# Patient Record
Sex: Female | Born: 2000 | Race: White | Hispanic: No | Smoking: Never smoker
Health system: Southern US, Community
[De-identification: ages and names within clinical notes are randomized; demographics above are authoritative.]

## PROBLEM LIST (undated history)

## (undated) DIAGNOSIS — I471 Supraventricular tachycardia, unspecified: Secondary | ICD-10-CM

## (undated) DIAGNOSIS — E079 Disorder of thyroid, unspecified: Secondary | ICD-10-CM

---

## 2021-07-05 ENCOUNTER — Other Ambulatory Visit: Payer: Self-pay

## 2021-07-05 ENCOUNTER — Emergency Department: Payer: BC Managed Care – PPO

## 2021-07-05 ENCOUNTER — Emergency Department
Admission: EM | Admit: 2021-07-05 | Discharge: 2021-07-05 | Disposition: A | Discharge status: Home / Self Care | Payer: BC Managed Care – PPO | Attending: Emergency Medicine | Admitting: Emergency Medicine

## 2021-07-05 DIAGNOSIS — R109 Unspecified abdominal pain: Secondary | ICD-10-CM

## 2021-07-05 DIAGNOSIS — R1031 Right lower quadrant pain: Secondary | ICD-10-CM | POA: Diagnosis not present

## 2021-07-05 DIAGNOSIS — N9489 Other specified conditions associated with female genital organs and menstrual cycle: Secondary | ICD-10-CM | POA: Diagnosis not present

## 2021-07-05 DIAGNOSIS — R0789 Other chest pain: Secondary | ICD-10-CM | POA: Diagnosis not present

## 2021-07-05 DIAGNOSIS — R079 Chest pain, unspecified: Secondary | ICD-10-CM

## 2021-07-05 DIAGNOSIS — R42 Dizziness and giddiness: Secondary | ICD-10-CM | POA: Diagnosis not present

## 2021-07-05 HISTORY — DX: Supraventricular tachycardia, unspecified: I47.10

## 2021-07-05 HISTORY — DX: Disorder of thyroid, unspecified: E07.9

## 2021-07-05 HISTORY — DX: Supraventricular tachycardia: I47.1

## 2021-07-05 LAB — CBC
HCT: 43.3 % (ref 36.0–46.0)
Hemoglobin: 14.7 g/dL (ref 12.0–15.0)
MCH: 29.5 pg (ref 26.0–34.0)
MCHC: 33.9 g/dL (ref 30.0–36.0)
MCV: 86.8 fL (ref 80.0–100.0)
Platelets: 395 10*3/uL (ref 150–400)
RBC: 4.99 MIL/uL (ref 3.87–5.11)
RDW: 12.6 % (ref 11.5–15.5)
WBC: 9.1 10*3/uL (ref 4.0–10.5)
nRBC: 0 % (ref 0.0–0.2)

## 2021-07-05 LAB — BASIC METABOLIC PANEL
Anion gap: 8 (ref 5–15)
BUN: 13 mg/dL (ref 6–20)
CO2: 24 mmol/L (ref 22–32)
Calcium: 9.7 mg/dL (ref 8.9–10.3)
Chloride: 106 mmol/L (ref 98–111)
Creatinine, Ser: 0.71 mg/dL (ref 0.44–1.00)
GFR, Estimated: >60 mL/min (ref >60)
Glucose, Bld: 101 mg/dL — ABNORMAL HIGH (ref 70–99)
Potassium: 4.2 mmol/L (ref 3.5–5.1)
Sodium: 138 mmol/L (ref 135–145)

## 2021-07-05 LAB — TROPONIN I (HIGH SENSITIVITY): Troponin I (High Sensitivity): 2 ng/L (ref <18)

## 2021-07-05 LAB — HCG, QUANTITATIVE, PREGNANCY: hCG, Beta Chain, Quant, S: <1 m[IU]/mL (ref <5)

## 2021-07-05 MED ORDER — LIDOCAINE VISCOUS HCL 2 % MT SOLN
15.0000 mL | Freq: Once | OROMUCOSAL | Status: AC
  Administered 2021-07-05: 15 mL via ORAL
  Filled 2021-07-05: qty 15

## 2021-07-05 MED ORDER — ALUM & MAG HYDROXIDE-SIMETH 200-200-20 MG/5ML PO SUSP
30.0000 mL | Freq: Once | ORAL | Status: AC
  Administered 2021-07-05: 30 mL via ORAL
  Filled 2021-07-05: qty 30

## 2021-07-05 NOTE — ED Notes (Signed)
Pt is tearful and anxious at time of discharge. RN assisted pt through deep breathing exercises until she was comfortable prior to reviewing discharge instructions. Unable to obtain signature for discharge as computer at bedside will not turn on. Pt verbalized understanding of instructions and will call cardiologist today as recommended. Ambulatory to lobby for exit.

## 2021-07-05 NOTE — ED Triage Notes (Signed)
Pt to ED [facility] from work for tingling in extremities, chest pressure and dizziness that started yesterday.  Hx SVT. Compliant with meds. NSR with EMS.  Pt in NAD, alert and oriented. RR Even and unlabored

## 2021-07-05 NOTE — ED Provider Notes (Signed)
[facility] Emergency Department Provider Note   ____________________________________________    I have reviewed the triage vital signs and the nursing notes.   HISTORY  Chief Complaint Dizziness     HPI Lauren Bond is a 20 y.o. female who presents with multiple complaints.  Patient reports 2 days ago she had an episode of dizziness while at work, she felt lightheaded and had to sit down.  EMS was called and she was evaluated but was not transported.  Since last Thursday she reports she has been having pain in her right flank/right lower quadrant which she describes as sharp and somewhat intermittent.  She assumed that it could be an ovarian cyst.  Denies fevers or chills.  No nausea or vomiting.  No diarrhea.  Primary complaint today is right lower abdominal pain although she also reports that she has had some chest discomfort intermittently which is vague and without radiation.  Past Medical History:  Diagnosis Date   SVT (supraventricular tachycardia) (HCC)    Thyroid disease     There are no problems to display for this patient.     Prior to Admission medications   Not on File     Allergies Penicillins  No family history on file.  Social History   Denies cigarettes, no drugs Review of Systems  Constitutional: No fever/chills Eyes: No visual changes.  ENT: No sore throat. Cardiovascular: As above Respiratory: Denies shortness of breath. Gastrointestinal: As above Genitourinary: Negative for dysuria. Musculoskeletal: Negative for back pain. Skin: Negative for rash. Neurological: Negative for headaches    ____________________________________________   PHYSICAL EXAM:  VITAL SIGNS: ED Triage Vitals  Enc Vitals Group     BP 07/05/21 1138 (!) 135/103     Pulse Rate 07/05/21 1138 95     Resp 07/05/21 1138 20     Temp 07/05/21 1138 99 F (37.2 C)     Temp Source 07/05/21 1138 Oral     SpO2 07/05/21 1138 97 %     Weight  07/05/21 1139 97.5 kg (215 lb)     Height 07/05/21 1139 1.626 m (5\' 4" )     Head Circumference --      Peak Flow --      Pain Score 07/05/21 1139 6     Pain Loc --      Pain Edu? --      Excl. in GC? --     Constitutional: Alert and oriented.  Eyes: Conjunctivae are normal.  Head: Atraumatic. Nose: No congestion/rhinnorhea. Mouth/Throat: Mucous membranes are moist.    Cardiovascular: Normal rate, regular rhythm. Grossly normal heart sounds.  Good peripheral circulation. Respiratory: Normal respiratory effort.  No retractions. Lungs CTAB. Gastrointestinal: Soft and nontender. No distention.  No CVA tenderness.  Musculoskeletal: No lower extremity tenderness nor edema.  Warm and well perfused Neurologic:  Normal speech and language. No gross focal neurologic deficits are appreciated.  Skin:  Skin is warm, dry and intact. No rash noted. Psychiatric: Mood and affect are normal. Speech and behavior are normal.  ____________________________________________   LABS (all labs ordered are listed, but only abnormal results are displayed)  Labs Reviewed  BASIC METABOLIC PANEL - Abnormal; Notable for the following components:      Result Value   Glucose, Bld 101 (*)    All other components within normal limits  CBC  HCG, QUANTITATIVE, PREGNANCY  TROPONIN I (HIGH SENSITIVITY)   ____________________________________________  EKG  ED ECG REPORT I, 07/07/21, the attending  physician, personally viewed and interpreted this ECG.  Date: 07/05/2021  Rhythm: Sinus tach QRS Axis: normal Intervals: normal ST/T Wave abnormalities: normal Narrative Interpretation: no evidence of acute ischemia  ____________________________________________  RADIOLOGY  Chest x-ray viewed by me, no acute abnormality ____________________________________________   PROCEDURES  Procedure(s) performed: No  Procedures   Critical Care performed:  No ____________________________________________   INITIAL IMPRESSION / ASSESSMENT AND PLAN / ED COURSE  Pertinent labs & imaging results that were available during my care of the patient were reviewed by me and considered in my medical decision making (see chart for details).   Patient presents with multiple complaints as detailed above, primary concern is right lower quadrant abdominal pain.  Has been ongoing for a week intermittently, doubt appendicitis given time course.  Possibility of ureterolithiasis, UTI, pending urine, CT renal stone study.  High sensitivity troponin and EKG are reassuring, chest x-ray without abnormality.  Very low risk for ACS, not consistent with myocarditis or pericarditis.  No shortness of breath or pleurisy  CT renal stone study is negative for ureterolithiasis or other abnormality.  ----------------------------------------- 3:39 PM on 07/05/2021 ----------------------------------------- Patient feeling improved, will need follow-up with cardiology given her history of SVT on metoprolol, return precautions discussed, appropriate for discharge at this time    ____________________________________________   FINAL CLINICAL IMPRESSION(S) / ED DIAGNOSES  Final diagnoses:  Abdominal cramping  Chest pain, unspecified type        Note:  This document was prepared using Dragon voice recognition software and may include unintentional dictation errors.    Jene Every, MD 07/05/21 1539

## 2021-08-16 ENCOUNTER — Ambulatory Visit: Payer: BC Managed Care – PPO | Admitting: Cardiology

## 2021-09-21 ENCOUNTER — Encounter: Payer: Self-pay | Admitting: Cardiology

## 2021-09-21 ENCOUNTER — Ambulatory Visit: Payer: BC Managed Care – PPO | Admitting: Cardiology

## 2021-09-21 ENCOUNTER — Other Ambulatory Visit: Payer: Self-pay

## 2021-09-21 VITALS — BP 120/80 | HR 98 | Ht 64.0 in | Wt 207.0 lb

## 2021-09-21 DIAGNOSIS — R072 Precordial pain: Secondary | ICD-10-CM

## 2021-09-21 DIAGNOSIS — I471 Supraventricular tachycardia: Secondary | ICD-10-CM | POA: Diagnosis not present

## 2021-09-21 MED ORDER — FUROSEMIDE 20 MG PO TABS: 20.0000 mg | ORAL_TABLET | ORAL | 3 refills | Status: DC | PRN

## 2021-09-21 NOTE — Progress Notes (Signed)
Cardiology Office Note:    Date:  09/21/2021   ID:  Lauren Bond, DOB 02-Oct-2001, MRN [id number]  PCP:  Pcp, No   [facility] Providers Cardiologist:  None     Referring MD: Lauren Every, MD   Chief Complaint  Patient presents with   New Patient (Initial Visit)    ED follow up -- Chest pain and SOB. Meds reviewed verbally with patient.    Lauren Bond is a 20 y.o. female who is being seen today for the evaluation of chest pain at the request of Lauren Every, MD.   History of Present Illness:    Lauren Bond is a 20 y.o. female with a hx of SVT, anxiety, depression who presents with chest pain and elevated heart rates.  Patient recently moved to the area from [facility].  While in [place], she had episodes of tachycardia and elevated heart rates.  Cardiac monitor was cardiology, revealing sinus tachycardia and bouts of SVT.  She was started on Lopressor 25 mg twice daily with improvement in heart rates.  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  Thyroid function has been normal since.  She tried going up on her metoprolol to 37.5 mg twice daily, developed dizziness, hypotension.  Current heart rates range from 80s to 130s beats per minute on Lopressor 25 mg twice daily.  She has occasional chest pain/tightness, denies any family history of heart disease.  Past Medical History:  Diagnosis Date   SVT (supraventricular tachycardia) (HCC)    Thyroid disease     History reviewed. No pertinent surgical history.  Current Medications: Current Meds  Medication Sig   buPROPion (WELLBUTRIN XL) 300 MG 24 hr tablet Take 300 mg by mouth daily.   cyclobenzaprine (FLEXERIL) 10 MG tablet Take 10 mg by mouth 3 (three) times daily as needed for muscle spasms.   ibuprofen (ADVIL) 200 MG tablet Take 200 mg by mouth Bond 6 (six) hours as needed.   metoprolol tartrate (LOPRESSOR) 25 MG tablet Take 25 mg by mouth 2 (two) times daily.   naproxen sodium (ALEVE) 220 MG tablet  Take 220 mg by mouth.   triamcinolone lotion (KENALOG) 0.1 % Apply 1 application topically 3 (three) times daily.   venlafaxine XR (EFFEXOR-XR) 37.5 MG 24 hr capsule Take 37.5 mg by mouth daily with breakfast.   [DISCONTINUED] furosemide (LASIX) 20 MG tablet Take 1 tablet (20 mg total) by mouth as needed. For swelling     Allergies:   Penicillins   Social History   Socioeconomic History   Marital status: Single    Spouse name: Not on file   Number of children: Not on file   Years of education: Not on file   Highest education level: Not on file  Occupational History   Not on file  Tobacco Use   Smoking status: Never    Passive exposure: Never   Smokeless tobacco: Never  Vaping Use   Vaping Use: Never used  Substance and Sexual Activity   Alcohol use: Never   Drug use: Never   Sexual activity: Not on file  Other Topics Concern   Not on file  Social History Narrative   Not on file   Social Determinants of Health   Financial Resource Strain: Not on file  Food Insecurity: Not on file  Transportation Needs: Not on file  Physical Activity: Not on file  Stress: Not on file  Social Connections: Not on file     Family History:  The patient's family history is not on file.  ROS:   Please see the history of present illness.     All other systems reviewed and are negative.  EKGs/Labs/Other Studies Reviewed:    The following studies were reviewed today:   EKG:  EKG is  ordered today.  The ekg ordered today demonstrates normal sinus rhythm, heart rate 98.  Recent Labs: 07/05/2021: BUN 13; Creatinine, Ser 0.71; Hemoglobin 14.7; Platelets 395; Potassium 4.2; Sodium 138  Recent Lipid Panel No results found for: CHOL, TRIG, HDL, CHOLHDL, VLDL, LDLCALC, LDLDIRECT   Risk Assessment/Calculations:          Physical Exam:    VS:  BP 120/80 (BP Location: Left Arm, Patient Position: Sitting, Cuff Size: Normal)   Pulse 98   Ht 5\' 4"  (1.626 m)   Wt 207 lb (93.9 kg)   SpO2  98%   BMI 35.53 kg/m     Wt Readings from Last 3 Encounters:  09/21/21 207 lb (93.9 kg)  07/05/21 215 lb (97.5 kg)     GEN:  Well nourished, well developed in no acute distress HEENT: Normal NECK: No JVD; No carotid bruits LYMPHATICS: No lymphadenopathy CARDIAC: RRR, no murmurs, rubs, gallops RESPIRATORY:  Clear to auscultation without rales, wheezing or rhonchi  ABDOMEN: Soft, non-tender, non-distended MUSCULOSKELETAL:  No edema; No deformity  SKIN: Warm and dry NEUROLOGIC:  Alert and oriented x 3 PSYCHIATRIC:  Normal affect   ASSESSMENT:    1. Paroxysmal SVT (supraventricular tachycardia) (HCC)   2. Precordial pain    PLAN:    In order of problems listed above:  History of sinus tachycardia, paroxysmal SVT.  Continue Lopressor 25 mg twice daily.  Okay to take extra dose of Lopressor for episodes of elevated heart rates.  Component of anxiety could be contributing to patient's symptoms.  Patient has not tolerated higher doses of Lopressor, leading to hypotension and dizziness. Nonspecific chest pressure, get echo to evaluate any structural abnormalities.  Follow-up after echocardiogram.     Medication Adjustments/Labs and Tests Ordered: Current medicines are reviewed at length with the patient today.  Concerns regarding medicines are outlined above.  Orders Placed This Encounter  Procedures   EKG 12-Lead   ECHOCARDIOGRAM COMPLETE   Meds ordered this encounter  Medications   DISCONTD: furosemide (LASIX) 20 MG tablet    Sig: Take 1 tablet (20 mg total) by mouth as needed. For swelling    Dispense:  30 tablet    Refill:  3   furosemide (LASIX) 20 MG tablet    Sig: Take 1 tablet (20 mg total) by mouth as needed. For swelling    Dispense:  30 tablet    Refill:  3     Patient Instructions  Medication Instructions:   Your physician has recommended you make the following change in your medication:    START taking Lasix (Furosemide) 20 MG as needed for  swelling.   *If you need a refill on your cardiac medications before your next appointment, please call your pharmacy*   Lab Work:  None ordered  If you have labs (blood work) drawn today and your tests are completely normal, you will receive your results only by: MyChart Message (if you have MyChart) OR A paper copy in the mail If you have any lab test that is abnormal or we need to change your treatment, we will call you to review the results.   Testing/Procedures:  Your physician has requested that you have an echocardiogram. Echocardiography  is a painless test that uses sound waves to create images of your heart. It provides your doctor with information about the size and shape of your heart and how well your heart's chambers and valves are working. This procedure takes approximately one hour. There are no restrictions for this procedure.    Follow-Up: At [facility], you and your health needs are our priority.  As part of our continuing mission to provide you with exceptional heart care, we have created designated Provider Care Teams.  These Care Teams include your primary Cardiologist (physician) and Advanced Practice Providers (APPs -  Physician Assistants and Nurse Practitioners) who all work together to provide you with the care you need, when you need it.  We recommend signing up for the patient portal called "MyChart".  Sign up information is provided on this After Visit Summary.  MyChart is used to connect with patients for Virtual Visits (Telemedicine).  Patients are able to view lab/test results, encounter notes, upcoming appointments, etc.  Non-urgent messages can be sent to your provider as well.   To learn more about what you can do with MyChart, go to [email or web address].    Your next appointment:    2 month(s)  The format for your next appointment:   In Person  Provider:   Debbe Odea, MD   Other Instructions    Signed, Debbe Odea, MD   09/21/2021 5:13 PM    [facility]

## 2021-09-21 NOTE — Patient Instructions (Addendum)
Medication Instructions:   Your physician has recommended you make the following change in your medication:    START taking Lasix (Furosemide) 20 MG as needed for swelling.   *If you need a refill on your cardiac medications before your next appointment, please call your pharmacy*   Lab Work:  None ordered  If you have labs (blood work) drawn today and your tests are completely normal, you will receive your results only by: MyChart Message (if you have MyChart) OR A paper copy in the mail If you have any lab test that is abnormal or we need to change your treatment, we will call you to review the results.   Testing/Procedures:  Your physician has requested that you have an echocardiogram. Echocardiography is a painless test that uses sound waves to create images of your heart. It provides your doctor with information about the size and shape of your heart and how well your heart's chambers and valves are working. This procedure takes approximately one hour. There are no restrictions for this procedure.    Follow-Up: At [facility], you and your health needs are our priority.  As part of our continuing mission to provide you with exceptional heart care, we have created designated Provider Care Teams.  These Care Teams include your primary Cardiologist (physician) and Advanced Practice Providers (APPs -  Physician Assistants and Nurse Practitioners) who all work together to provide you with the care you need, when you need it.  We recommend signing up for the patient portal called "MyChart".  Sign up information is provided on this After Visit Summary.  MyChart is used to connect with patients for Virtual Visits (Telemedicine).  Patients are able to view lab/test results, encounter notes, upcoming appointments, etc.  Non-urgent messages can be sent to your provider as well.   To learn more about what you can do with MyChart, go to [email or web address].    Your next appointment:     2 month(s)  The format for your next appointment:   In Person  Provider:   Debbe Odea, MD   Other Instructions

## 2021-10-31 ENCOUNTER — Ambulatory Visit (INDEPENDENT_AMBULATORY_CARE_PROVIDER_SITE_OTHER): Payer: BC Managed Care – PPO

## 2021-10-31 ENCOUNTER — Other Ambulatory Visit: Payer: Self-pay

## 2021-10-31 DIAGNOSIS — I471 Supraventricular tachycardia: Secondary | ICD-10-CM | POA: Diagnosis not present

## 2021-10-31 LAB — ECHOCARDIOGRAM COMPLETE
AR max vel: 3.35 cm2
AV Area VTI: 3.36 cm2
AV Area mean vel: 3.74 cm2
AV Mean grad: 2 mmHg
AV Peak grad: 3.6 mmHg
Ao pk vel: 0.95 m/s
Area-P 1/2: 3.83 cm2
Calc EF: 55.4 %
S' Lateral: 3.2 cm
Single Plane A2C EF: 53.8 %
Single Plane A4C EF: 55.9 %

## 2021-11-05 ENCOUNTER — Telehealth: Payer: Self-pay | Admitting: *Deleted

## 2021-11-05 NOTE — Telephone Encounter (Signed)
Results reviewed with patient and upcoming appointment confirmed. She verbalized understanding with no further questions at this time.

## 2021-11-05 NOTE — Telephone Encounter (Signed)
-----   Message from Debbe Odea, MD sent at 11/01/2021  7:16 PM EST ----- Normal systolic and diastolic function, normal echocardiogram.

## 2021-11-05 NOTE — Telephone Encounter (Signed)
Patient calling to discuss recent testing results  ° °Please call  ° °

## 2021-11-05 NOTE — Telephone Encounter (Signed)
Left voicemail message to call back for review of results.  

## 2021-11-22 ENCOUNTER — Ambulatory Visit: Payer: BC Managed Care – PPO | Admitting: Cardiology

## 2021-11-22 ENCOUNTER — Encounter: Payer: Self-pay | Admitting: Cardiology

## 2021-11-22 ENCOUNTER — Other Ambulatory Visit: Payer: Self-pay

## 2021-11-22 VITALS — BP 120/80 | HR 88 | Ht 64.0 in | Wt 209.0 lb

## 2021-11-22 DIAGNOSIS — I471 Supraventricular tachycardia: Secondary | ICD-10-CM

## 2021-11-22 DIAGNOSIS — R072 Precordial pain: Secondary | ICD-10-CM

## 2021-11-22 NOTE — Patient Instructions (Signed)
Medication Instructions:  °- Your physician recommends that you continue on your current medications as directed. Please refer to the Current Medication list given to you today. ° °*If you need a refill on your cardiac medications before your next appointment, please call your pharmacy* ° ° °Lab Work: °- none ordered ° °If you have labs (blood work) drawn today and your tests are completely normal, you will receive your results only by: °MyChart Message (if you have MyChart) OR °A paper copy in the mail °If you have any lab test that is abnormal or we need to change your treatment, we will call you to review the results. ° ° °Testing/Procedures: °- none ordered ° ° °Follow-Up: °At [facility], you and your health needs are our priority.  As part of our continuing mission to provide you with exceptional heart care, we have created designated Provider Care Teams.  These Care Teams include your primary Cardiologist (physician) and Advanced Practice Providers (APPs -  Physician Assistants and Nurse Practitioners) who all work together to provide you with the care you need, when you need it. ° °We recommend signing up for the patient portal called "MyChart".  Sign up information is provided on this After Visit Summary.  MyChart is used to connect with patients for Virtual Visits (Telemedicine).  Patients are able to view lab/test results, encounter notes, upcoming appointments, etc.  Non-urgent messages can be sent to your provider as well.   °To learn more about what you can do with MyChart, go to [email or web address].   ° °Your next appointment:   °1 year(s) ° °The format for your next appointment:   °In Person ° °Provider:   °You may see Brian Agbor-Etang, MD or one of the following Advanced Practice Providers on your designated Care Team:   °Christopher Berge, NP °Ryan Dunn, PA-C °Cadence Furth, PA-C  ° ° °Other Instructions °N/a ° °

## 2021-11-22 NOTE — Progress Notes (Signed)
Cardiology Office Note:    Date:  11/22/2021   ID:  Lauren Bond, DOB Apr 18, 2001, MRN [id number]  PCP:  Pcp, No   [facility] Providers Cardiologist:  None     Referring MD: Debbe Odea, MD   Chief Complaint  Patient presents with   Follow-up    3 Months follow up and review echo results, per patient she has nothing new to discuss today. Medications verbally reviewed with patient.     History of Present Illness:    Lauren Bond is a 20 y.o. female with a hx of eczema SVT, anxiety, depression who presents for follow-up.  Previously seen with chest pain and elevated heart rates.  Started on metoprolol with improvement in heart rates, tried increasing dose of metoprolol to 37.5 mg twice daily but patient could not tolerate as she developed symptoms of dizziness and hypotension.  Due to chest pain, echocardiogram was ordered to evaluate any structural abnormalities.  Chest discomfort not consistent with angina.  She has no cardiac risk factors.  Presents for echocardiogram results.  Would like to get an orthostatic test as this was performed by her previous cardiologist.   Past Medical History:  Diagnosis Date   SVT (supraventricular tachycardia) (HCC)    Thyroid disease     History reviewed. No pertinent surgical history.  Current Medications: Current Meds  Medication Sig   buPROPion (WELLBUTRIN XL) 300 MG 24 hr tablet Take 300 mg by mouth daily.   cyclobenzaprine (FLEXERIL) 10 MG tablet Take 10 mg by mouth 3 (three) times daily as needed for muscle spasms.   ibuprofen (ADVIL) 200 MG tablet Take 200 mg by mouth every 6 (six) hours as needed.   metoprolol succinate (TOPROL-XL) 25 MG 24 hr tablet Take 25 mg by mouth in the morning and at bedtime.   naproxen sodium (ALEVE) 220 MG tablet Take 220 mg by mouth.   TRI-LO-SPRINTEC 0.18/0.215/0.25 MG-25 MCG tab Take 1 tablet by mouth daily.   triamcinolone lotion (KENALOG) 0.1 % Apply 1 application topically 3 (three) times daily.    venlafaxine XR (EFFEXOR-XR) 75 MG 24 hr capsule Take 75 mg by mouth daily.     Allergies:   Penicillins   Social History   Socioeconomic History   Marital status: Single    Spouse name: Not on file   Number of children: Not on file   Years of education: Not on file   Highest education level: Not on file  Occupational History   Not on file  Tobacco Use   Smoking status: Never    Passive exposure: Never   Smokeless tobacco: Never  Vaping Use   Vaping Use: Never used  Substance and Sexual Activity   Alcohol use: Never   Drug use: Never   Sexual activity: Not on file  Other Topics Concern   Not on file  Social History Narrative   Not on file   Social Determinants of Health   Financial Resource Strain: Not on file  Food Insecurity: Not on file  Transportation Needs: Not on file  Physical Activity: Not on file  Stress: Not on file  Social Connections: Not on file     Family History: The patient's family history is not on file.  ROS:   Please see the history of present illness.     All other systems reviewed and are negative.  EKGs/Labs/Other Studies Reviewed:    The following studies were reviewed today:   EKG:  EKG not ordered today.   Recent  Labs: 07/05/2021: BUN 13; Creatinine, Ser 0.71; Hemoglobin 14.7; Platelets 395; Potassium 4.2; Sodium 138  Recent Lipid Panel No results found for: CHOL, TRIG, HDL, CHOLHDL, VLDL, LDLCALC, LDLDIRECT   Risk Assessment/Calculations:          Physical Exam:    VS:  BP 120/80 (BP Location: Left Arm, Patient Position: Sitting, Cuff Size: Normal)   Pulse 88   Ht 5\' 4"  (1.626 m)   Wt 209 lb (94.8 kg)   SpO2 98%   BMI 35.87 kg/m     Wt Readings from Last 3 Encounters:  11/22/21 209 lb (94.8 kg)  09/21/21 207 lb (93.9 kg)  07/05/21 215 lb (97.5 kg)     GEN:  Well nourished, well developed in no acute distress HEENT: Normal NECK: No JVD; No carotid bruits LYMPHATICS: No lymphadenopathy CARDIAC: RRR, no  murmurs, rubs, gallops RESPIRATORY:  Clear to auscultation without rales, wheezing or rhonchi  ABDOMEN: Soft, non-tender, non-distended MUSCULOSKELETAL:  No edema; No deformity  SKIN: Warm and dry NEUROLOGIC:  Alert and oriented x 3 PSYCHIATRIC:  Normal affect   ASSESSMENT:    1. Paroxysmal SVT (supraventricular tachycardia) (HCC)   2. Precordial pain     PLAN:    In order of problems listed above:  History of sinus tachycardia, paroxysmal SVT.  Orthostatic vitals with no evidence for orthostasis.  Mild tachycardia noted with heart rates in the 120s.  Continue Lopressor 25 mg twice daily.  Did not tolerate higher doses of Lopressor with episodes of fatigue and hypotension.  AV nodal causing channel blockers were considered but this will make blood pressure issues/hypotension more profound.  Okay to take extra dose of Lopressor for episodes of elevated heart rates.  Component of anxiety could be contributing to patient's symptoms. Nonspecific chest pressure, echocardiogram showed normal systolic and diastolic function, no structural abnormalities.  Symptoms not consistent with angina.  Patient made aware of results, reassured.  Follow-up yearly     Medication Adjustments/Labs and Tests Ordered: Current medicines are reviewed at length with the patient today.  Concerns regarding medicines are outlined above.  No orders of the defined types were placed in this encounter.  No orders of the defined types were placed in this encounter.    Patient Instructions  Medication Instructions:  - Your physician recommends that you continue on your current medications as directed. Please refer to the Current Medication list given to you today.  *If you need a refill on your cardiac medications before your next appointment, please call your pharmacy*   Lab Work: - none ordered  If you have labs (blood work) drawn today and your tests are completely normal, you will receive your results only  by: MyChart Message (if you have MyChart) OR A paper copy in the mail If you have any lab test that is abnormal or we need to change your treatment, we will call you to review the results.   Testing/Procedures: - none ordered   Follow-Up: At [facility], you and your health needs are our priority.  As part of our continuing mission to provide you with exceptional heart care, we have created designated Provider Care Teams.  These Care Teams include your primary Cardiologist (physician) and Advanced Practice Providers (APPs -  Physician Assistants and Nurse Practitioners) who all work together to provide you with the care you need, when you need it.  We recommend signing up for the patient portal called "MyChart".  Sign up information is provided on this After Visit  Summary.  MyChart is used to connect with patients for Virtual Visits (Telemedicine).  Patients are able to view lab/test results, encounter notes, upcoming appointments, etc.  Non-urgent messages can be sent to your provider as well.   To learn more about what you can do with MyChart, go to [email or web address].    Your next appointment:   1 year(s)  The format for your next appointment:   In Person  Provider:   You may see Debbe Odea, MD or one of the following Advanced Practice Providers on your designated Care Team:   Nicolasa Ducking, NP Eula Listen, PA-C Cadence Fransico Michael, [place]    Other Instructions N/a   Signed, Debbe Odea, MD  11/22/2021 12:48 PM    [facility]

## 2022-01-05 IMAGING — CR DG CHEST 2V
1 series · 2 of 2 positions shown · non-contrast
Comparison: None.

CLINICAL DATA: Chest pain. Chest pressure, dizziness, and tingling
in the extremities.

EXAM:
CHEST - 2 VIEW

[Series 1: dg chest 2 view · 0.14mm/px · 2 of 2 slices shown]
[im 1/2]
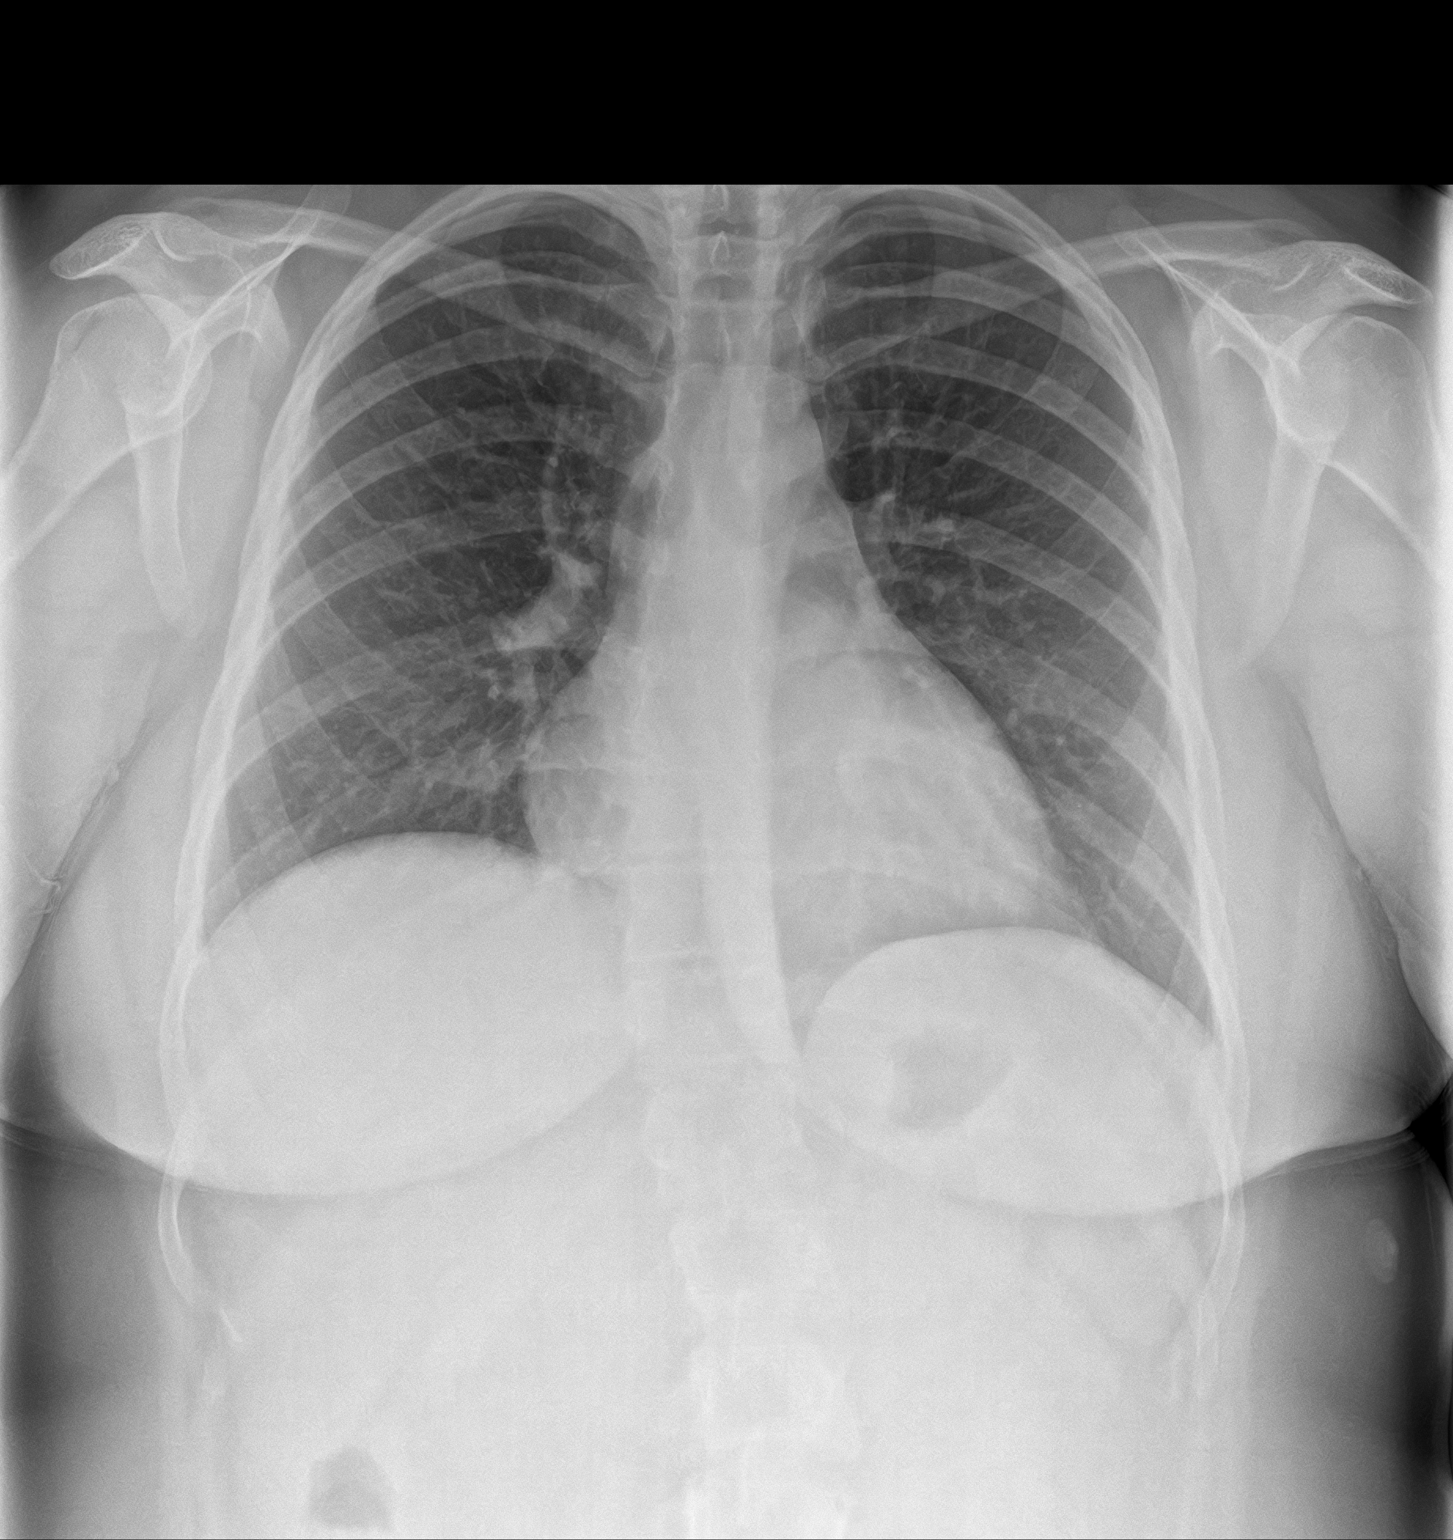
[im 2/2]
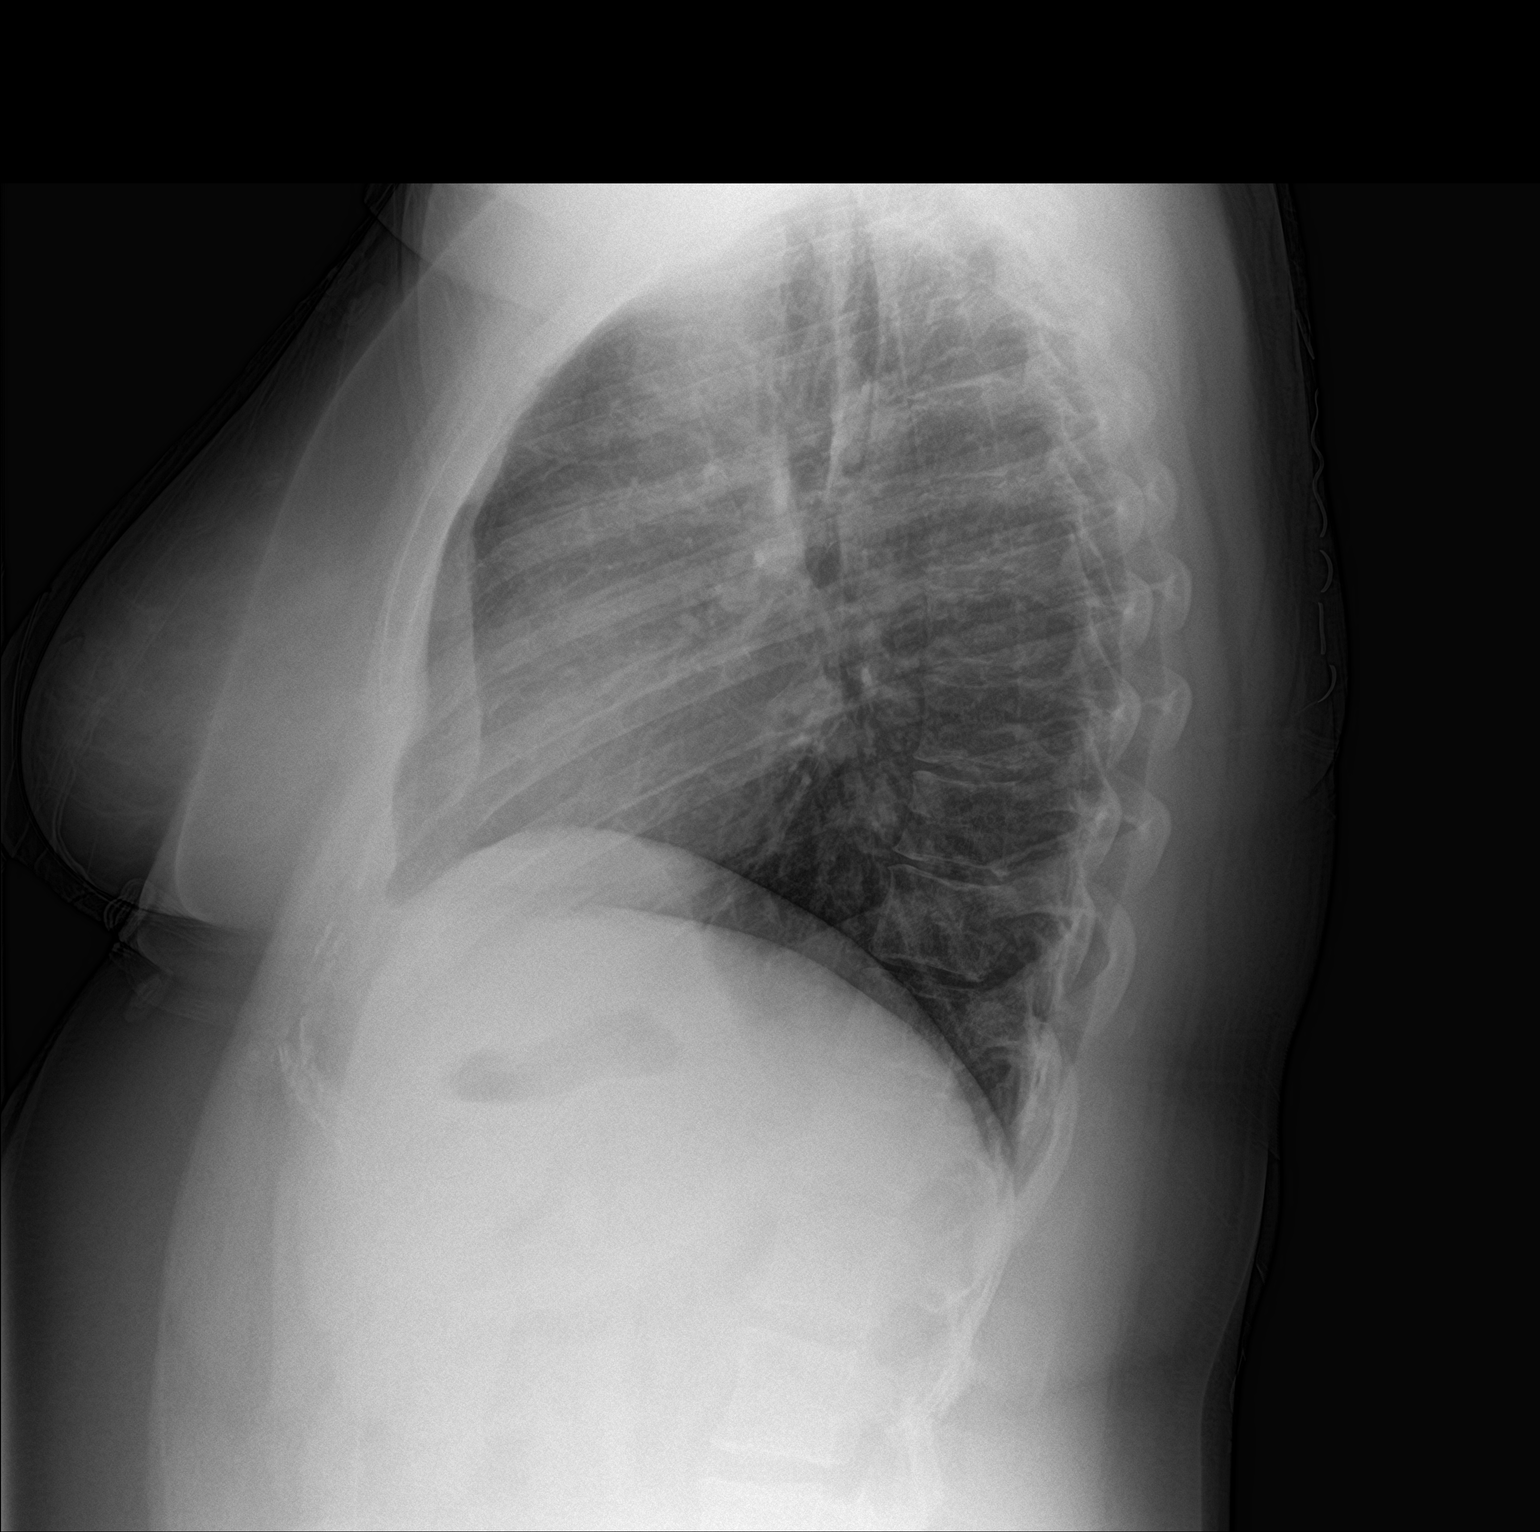

[2 of 2 positions shown; findings below may reference images not displayed]

FINDINGS: The cardiac silhouette is upper limits of normal in size. There is
slight elevation of the right hemidiaphragm. No airspace
consolidation, edema, pleural effusion, pneumothorax is identified.
There is mild thoracic dextroscoliosis.
IMPRESSION: No active cardiopulmonary disease.

## 2022-01-31 ENCOUNTER — Telehealth: Payer: BC Managed Care – PPO | Admitting: Physician Assistant

## 2022-01-31 DIAGNOSIS — R102 Pelvic and perineal pain: Secondary | ICD-10-CM

## 2022-01-31 MED ORDER — MELOXICAM 15 MG PO TABS: 15.0000 mg | ORAL_TABLET | Freq: Every day | ORAL | 0 refills | Status: AC

## 2022-01-31 NOTE — Patient Instructions (Signed)
°  [name], thank you for joining Piedad Climes, PA-C for today's virtual visit.  While this provider is not your primary care provider (PCP), if your PCP is located in our provider database this encounter information will be shared with them immediately following your visit.  Consent: (Patient) Danise Dehne provided verbal consent for this virtual visit at the beginning of the encounter.  Current Medications:  Current Outpatient Medications:    buPROPion (WELLBUTRIN XL) 300 MG 24 hr tablet, Take 300 mg by mouth daily., Disp: , Rfl:    cyclobenzaprine (FLEXERIL) 10 MG tablet, Take 10 mg by mouth 3 (three) times daily as needed for muscle spasms., Disp: , Rfl:    furosemide (LASIX) 20 MG tablet, Take 1 tablet (20 mg total) by mouth as needed. For swelling (Patient not taking: Reported on 11/22/2021), Disp: 30 tablet, Rfl: 3   ibuprofen (ADVIL) 200 MG tablet, Take 200 mg by mouth every 6 (six) hours as needed., Disp: , Rfl:    metoprolol succinate (TOPROL-XL) 25 MG 24 hr tablet, Take 25 mg by mouth in the morning and at bedtime., Disp: , Rfl:    naproxen sodium (ALEVE) 220 MG tablet, Take 220 mg by mouth., Disp: , Rfl:    TRI-LO-SPRINTEC 0.18/0.215/0.25 MG-25 MCG tab, Take 1 tablet by mouth daily., Disp: , Rfl:    triamcinolone lotion (KENALOG) 0.1 %, Apply 1 application topically 3 (three) times daily., Disp: , Rfl:    venlafaxine XR (EFFEXOR-XR) 37.5 MG 24 hr capsule, Take 37.5 mg by mouth daily with breakfast. (Patient not taking: Reported on 11/22/2021), Disp: , Rfl:    venlafaxine XR (EFFEXOR-XR) 75 MG 24 hr capsule, Take 75 mg by mouth daily., Disp: , Rfl:    Medications ordered in this encounter:  No orders of the defined types were placed in this encounter.    *If you need refills on other medications prior to your next appointment, please contact your pharmacy*  Follow-Up: Call back or seek an in-person evaluation if the symptoms worsen or if the condition fails to improve as  anticipated.  Other Instructions Please keep hydrated and get plenty of rest. Apply heating pad to the area for 10-15 minutes a couple of times per day.  Take the Mobic once daily with good. Tylenol for breakthrough pain. Continue your zofran if needed.   If symptoms are not continuing to improve/resolve or you note any new or worsening symptoms at all you need ER evaluation.    If you have been instructed to have an in-person evaluation today at a local Urgent Care facility, please use the link below. It will take you to a list of all of our available [facility], including address, phone number and hours of operation. Please do not delay care.  [facility]  If you or a family member do not have a primary care provider, use the link below to schedule a visit and establish care. When you choose a [facility] primary care physician or advanced practice provider, you gain a long-term partner in health. Find a Primary Care Provider  Learn more about [facility]'s in-office and virtual care options: [facility] - Get Care Now

## 2022-01-31 NOTE — Progress Notes (Signed)
Virtual Visit Consent   Lauren Bond, you are scheduled for a virtual visit with a [facility] provider today.     Just as with appointments in the office, your consent must be obtained to participate.  Your consent will be active for this visit and any virtual visit you may have with one of our providers in the next 365 days.     If you have a MyChart account, a copy of this consent can be sent to you electronically.  All virtual visits are billed to your insurance company just like a traditional visit in the office.    As this is a virtual visit, video technology does not allow for your provider to perform a traditional examination.  This may limit your provider's ability to fully assess your condition.  If your provider identifies any concerns that need to be evaluated in person or the need to arrange testing (such as labs, EKG, etc.), we will make arrangements to do so.     Although advances in technology are sophisticated, we cannot ensure that it will always work on either your end or our end.  If the connection with a video visit is poor, the visit may have to be switched to a telephone visit.  With either a video or telephone visit, we are not always able to ensure that we have a secure connection.     I need to obtain your verbal consent now.   Are you willing to proceed with your visit today?    Lauren Bond has provided verbal consent on 01/31/2022 for a virtual visit (video or telephone).   Lauren Bond, [place]   Date: 01/31/2022 4:19 PM   Virtual Visit via Video Note   I, Lauren Bond, [name]  ([id number], 03/08/2001) on 01/31/22 at  4:15 PM EST by a video-enabled telemedicine application and verified that I am speaking with the correct person using two identifiers.  Location: Patient: Virtual Visit Location Patient: Home Provider: Virtual Visit Location Provider: Home Office   I discussed the limitations of evaluation and management by telemedicine  and the availability of in person appointments. The patient expressed understanding and agreed to proceed.    History of Present Illness: Lauren Bond is a 21 y.o. who identifies as a female who was assigned female at birth, and is being seen today for some R pelvic discomfort starting on Monday morning. Is intermittent. Was sharp at onset but quickly transitioned into a dull pain. Denies fever, chills. Is noting some nausea with one episode of non-bloody emesis yesterday. Has restarted her Zofran that she has a script for with improvement in these symptoms. Pain is improved but still present, 5-6/10 at worst.  Has been taking Ibuprofen OTC which has helped. Notes her LMP was 2 weeks ago. Is taking an OCP daily. Is not sexually active. Denies vaginal symptoms. Has history of ovarian cysts with rupture. Notes this feels identical to prior episode in September.  Does not currently have a GYN. Is managed by her PCP only.   HPI: HPI  Problems: There are no problems to display for this patient.   Allergies:  Allergies  Allergen Reactions   Penicillins Rash   Medications:  Current Outpatient Medications:    diazepam (VALIUM) 2 MG tablet, Take by mouth., Disp: , Rfl:    ergocalciferol (VITAMIN D2) 1.25 MG (50000 UT) capsule, Take 1 capsule by mouth once a week., Disp: , Rfl:    hydrOXYzine (ATARAX) 10  MG tablet, TAKE 2 TABLETS BY MOUTH AT BEDTIME AS NEEDED, Disp: , Rfl:    ondansetron (ZOFRAN-ODT) 4 MG disintegrating tablet, DISSOLVE AND SWALLOW 2 TABLETS ON THE TONGUE TWICE DAILY, Disp: , Rfl:    buPROPion (WELLBUTRIN XL) 300 MG 24 hr tablet, Take 300 mg by mouth daily., Disp: , Rfl:    cyclobenzaprine (FLEXERIL) 10 MG tablet, Take 10 mg by mouth 3 (three) times daily as needed for muscle spasms., Disp: , Rfl:    metoprolol succinate (TOPROL-XL) 25 MG 24 hr tablet, Take 25 mg by mouth in the morning and at bedtime., Disp: , Rfl:    naproxen sodium (ALEVE) 220 MG tablet, Take 220 mg by mouth., Disp: ,  Rfl:    TRI-LO-SPRINTEC 0.18/0.215/0.25 MG-25 MCG tab, Take 1 tablet by mouth daily., Disp: , Rfl:    triamcinolone lotion (KENALOG) 0.1 %, Apply 1 application topically 3 (three) times daily., Disp: , Rfl:    venlafaxine XR (EFFEXOR-XR) 75 MG 24 hr capsule, Take 75 mg by mouth daily., Disp: , Rfl:   Observations/Objective: Patient is well-developed, well-nourished in no acute distress.  Resting comfortably at home.  Head is normocephalic, atraumatic.  No labored breathing. Speech is clear and coherent with logical content.  Patient is alert and oriented at baseline.   Assessment and Plan: 1. Pelvic pain in female  Mild to moderate at times. Starting Monday. Improved but still present. Has history of ovarian cyst and feels this is identical. Is not sexually active. No signs/symptoms present to increase risk of infection itself (fever, chills, worsening symptoms). Suspect likely another cyst. Will start Mobic once daily. Tylenol ES later in day if needed. Heating pad to the area. Keep hydrated. Discussed if not continuing to improve/resolve in next few days or any worsening symptoms at all she will need to seek immediate in-person evaluation.   Follow Up Instructions: I discussed the assessment and treatment plan with the patient. The patient was provided an opportunity to ask questions and all were answered. The patient agreed with the plan and demonstrated an understanding of the instructions.  A copy of instructions were sent to the patient via MyChart unless otherwise noted below.   The patient was advised to call back or seek an in-person evaluation if the symptoms worsen or if the condition fails to improve as anticipated.  Time:  I spent 10 minutes with the patient via telehealth technology discussing the above problems/concerns.    Lauren Rio, PA-C

## 2022-02-04 ENCOUNTER — Other Ambulatory Visit: Payer: Self-pay | Admitting: Student

## 2022-02-04 DIAGNOSIS — R200 Anesthesia of skin: Secondary | ICD-10-CM

## 2022-02-12 ENCOUNTER — Ambulatory Visit: Admission: RE | Admit: 2022-02-12 | Payer: BC Managed Care – PPO | Source: Ambulatory Visit

## 2022-02-25 ENCOUNTER — Telehealth: Payer: BC Managed Care – PPO | Admitting: Physician Assistant

## 2022-02-25 DIAGNOSIS — G43109 Migraine with aura, not intractable, without status migrainosus: Secondary | ICD-10-CM

## 2022-02-25 MED ORDER — RIZATRIPTAN BENZOATE 5 MG PO TABS: 5.0000 mg | ORAL_TABLET | ORAL | 0 refills | Status: AC | PRN

## 2022-02-25 MED ORDER — ONDANSETRON 4 MG PO TBDP: 4.0000 mg | ORAL_TABLET | Freq: Three times a day (TID) | ORAL | 0 refills | Status: DC | PRN

## 2022-02-25 MED ORDER — CYCLOBENZAPRINE HCL 10 MG PO TABS: 10.0000 mg | ORAL_TABLET | Freq: Three times a day (TID) | ORAL | 0 refills | Status: AC | PRN

## 2022-02-25 NOTE — Patient Instructions (Signed)
[name], thank you for joining Margaretann Loveless, PA-C for today's virtual visit.  While this provider is not your primary care provider (PCP), if your PCP is located in our provider database this encounter information will be shared with them immediately following your visit.  Consent: (Patient) Lauren Bond provided verbal consent for this virtual visit at the beginning of the encounter.  Current Medications:  Current Outpatient Medications:    rizatriptan (MAXALT) 5 MG tablet, Take 1 tablet (5 mg total) by mouth as needed for migraine. May repeat in 2 hours if needed, Disp: 10 tablet, Rfl: 0   buPROPion (WELLBUTRIN XL) 300 MG 24 hr tablet, Take 300 mg by mouth daily., Disp: , Rfl:    cyclobenzaprine (FLEXERIL) 10 MG tablet, Take 1 tablet (10 mg total) by mouth 3 (three) times daily as needed for muscle spasms., Disp: 30 tablet, Rfl: 0   diazepam (VALIUM) 2 MG tablet, Take by mouth., Disp: , Rfl:    ergocalciferol (VITAMIN D2) 1.25 MG (50000 UT) capsule, Take 1 capsule by mouth once a week., Disp: , Rfl:    hydrOXYzine (ATARAX) 10 MG tablet, TAKE 2 TABLETS BY MOUTH AT BEDTIME AS NEEDED, Disp: , Rfl:    meloxicam (MOBIC) 15 MG tablet, Take 1 tablet (15 mg total) by mouth daily., Disp: 15 tablet, Rfl: 0   metoprolol succinate (TOPROL-XL) 25 MG 24 hr tablet, Take 25 mg by mouth in the morning and at bedtime., Disp: , Rfl:    ondansetron (ZOFRAN-ODT) 4 MG disintegrating tablet, Take 1 tablet (4 mg total) by mouth every 8 (eight) hours as needed for nausea or vomiting., Disp: 30 tablet, Rfl: 0   TRI-LO-SPRINTEC 0.18/0.215/0.25 MG-25 MCG tab, Take 1 tablet by mouth daily., Disp: , Rfl:    triamcinolone lotion (KENALOG) 0.1 %, Apply 1 application topically 3 (three) times daily., Disp: , Rfl:    venlafaxine XR (EFFEXOR-XR) 75 MG 24 hr capsule, Take 75 mg by mouth daily., Disp: , Rfl:    Medications ordered in this encounter:  Meds ordered this encounter  Medications   cyclobenzaprine  (FLEXERIL) 10 MG tablet    Sig: Take 1 tablet (10 mg total) by mouth 3 (three) times daily as needed for muscle spasms.    Dispense:  30 tablet    Refill:  0    Order Specific Question:   Supervising Provider    Answer:   MILLER, BRIAN [[id number]]   ondansetron (ZOFRAN-ODT) 4 MG disintegrating tablet    Sig: Take 1 tablet (4 mg total) by mouth every 8 (eight) hours as needed for nausea or vomiting.    Dispense:  30 tablet    Refill:  0    Order Specific Question:   Supervising Provider    Answer:   Hyacinth Meeker, BRIAN [[id number]]   rizatriptan (MAXALT) 5 MG tablet    Sig: Take 1 tablet (5 mg total) by mouth as needed for migraine. May repeat in 2 hours if needed    Dispense:  10 tablet    Refill:  0    Order Specific Question:   Supervising Provider    Answer:   Hyacinth Meeker, BRIAN [[id number]]     *If you need refills on other medications prior to your next appointment, please contact your pharmacy*  Follow-Up: Call back or seek an in-person evaluation if the symptoms worsen or if the condition fails to improve as anticipated.  Other Instructions Migraine Headache A migraine headache is an intense, throbbing pain on one side or both  sides of the head. Migraine headaches may also cause other symptoms, such as nausea, vomiting, and sensitivity to light and noise. A migraine headache can last from 4 hours to 3 days. Talk with your doctor about what things may bring on (trigger) your migraine headaches. What are the causes? The exact cause of this condition is not known. However, a migraine may be caused when nerves in the brain become irritated and release chemicals that cause inflammation of blood vessels. This inflammation causes pain. This condition may be triggered or caused by: Drinking alcohol. Smoking. Taking medicines, such as: Medicine used to treat chest pain (nitroglycerin). Birth control pills. Estrogen. Certain blood pressure medicines. Eating or drinking products that contain nitrates,  glutamate, aspartame, or tyramine. Aged cheeses, chocolate, or caffeine may also be triggers. Doing physical activity. Other things that may trigger a migraine headache include: Menstruation. Pregnancy. Hunger. Stress. Lack of sleep or too much sleep. Weather changes. Fatigue. What increases the risk? The following factors may make you more likely to experience migraine headaches: Being a certain age. This condition is more common in people who are 31-90 years old. Being female. Having a family history of migraine headaches. Being Caucasian. Having a mental health condition, such as depression or anxiety. Being obese. What are the signs or symptoms? The main symptom of this condition is pulsating or throbbing pain. This pain may: Happen in any area of the head, such as on one side or both sides. Interfere with daily activities. Get worse with physical activity. Get worse with exposure to bright lights or loud noises. Other symptoms may include: Nausea. Vomiting. Dizziness. General sensitivity to bright lights, loud noises, or smells. Before you get a migraine headache, you may get warning signs (an aura). An aura may include: Seeing flashing lights or having blind spots. Seeing bright spots, halos, or zigzag lines. Having tunnel vision or blurred vision. Having numbness or a tingling feeling. Having trouble talking. Having muscle weakness. Some people have symptoms after a migraine headache (postdromal phase), such as: Feeling tired. Difficulty concentrating. How is this diagnosed? A migraine headache can be diagnosed based on: Your symptoms. A physical exam. Tests, such as: CT scan or an MRI of the head. These imaging tests can help rule out other causes of headaches. Taking fluid from the spine (lumbar puncture) and analyzing it (cerebrospinal fluid analysis, or CSF analysis). How is this treated? This condition may be treated with medicines that: Relieve  pain. Relieve nausea. Prevent migraine headaches. Treatment for this condition may also include: Acupuncture. Lifestyle changes like avoiding foods that trigger migraine headaches. Biofeedback. Cognitive behavioral therapy. Follow these instructions at home: Medicines Take over-the-counter and prescription medicines only as told by your health care provider. Ask your health care provider if the medicine prescribed to you: Requires you to avoid driving or using heavy machinery. Can cause constipation. You may need to take these actions to prevent or treat constipation: Drink enough fluid to keep your urine pale yellow. Take over-the-counter or prescription medicines. Eat foods that are high in fiber, such as beans, whole grains, and fresh fruits and vegetables. Limit foods that are high in fat and processed sugars, such as fried or sweet foods. Lifestyle Do not drink alcohol. Do not use any products that contain nicotine or tobacco, such as cigarettes, e-cigarettes, and chewing tobacco. If you need help quitting, ask your health care provider. Get at least 8 hours of sleep every night. Find ways to manage stress, such as meditation, deep breathing,  or yoga. General instructions   Keep a journal to find out what may trigger your migraine headaches. For example, write down: What you eat and drink. How much sleep you get. Any change to your diet or medicines. If you have a migraine headache: Avoid things that make your symptoms worse, such as bright lights. It may help to lie down in a dark, quiet room. Do not drive or use heavy machinery. Ask your health care provider what activities are safe for you while you are experiencing symptoms. Keep all follow-up visits as told by your health care provider. This is important. Contact a health care provider if: You develop symptoms that are different or more severe than your usual migraine headache symptoms. You have more than 15 headache  days in one month. Get help right away if: Your migraine headache becomes severe. Your migraine headache lasts longer than 72 hours. You have a fever. You have a stiff neck. You have vision loss. Your muscles feel weak or like you cannot control them. You start to lose your balance often. You have trouble walking. You faint. You have a seizure. Summary A migraine headache is an intense, throbbing pain on one side or both sides of the head. Migraines may also cause other symptoms, such as nausea, vomiting, and sensitivity to light and noise. This condition may be treated with medicines and lifestyle changes. You may also need to avoid certain things that trigger a migraine headache. Keep a journal to find out what may trigger your migraine headaches. Contact your health care provider if you have more than 15 headache days in a month or you develop symptoms that are different or more severe than your usual migraine headache symptoms. This information is not intended to replace advice given to you by your health care provider. Make sure you discuss any questions you have with your health care provider. Document Revised: 03/26/2019 Document Reviewed: 01/14/2019 Elsevier Patient Education  2022 [employer]    If you have been instructed to have an in-person evaluation today at a local Urgent Care facility, please use the link below. It will take you to a list of all of our available [facility], including address, phone number and hours of operation. Please do not delay care.  [facility]  If you or a family member do not have a primary care provider, use the link below to schedule a visit and establish care. When you choose a [facility] primary care physician or advanced practice provider, you gain a long-term partner in health. Find a Primary Care Provider  Learn more about [facility]'s in-office and virtual care options: [facility] - Get Care Now

## 2022-02-25 NOTE — Progress Notes (Signed)
?Virtual Visit Consent  ? ?Lauren Bond, you are scheduled for a virtual visit with a [facility] provider today.   ?  ?Just as with appointments in the office, your consent must be obtained to participate.  Your consent will be active for this visit and any virtual visit you may have with one of our providers in the next 365 days.   ?  ?If you have a MyChart account, a copy of this consent can be sent to you electronically.  All virtual visits are billed to your insurance company just like a traditional visit in the office.   ? ?As this is a virtual visit, video technology does not allow for your provider to perform a traditional examination.  This may limit your provider's ability to fully assess your condition.  If your provider identifies any concerns that need to be evaluated in person or the need to arrange testing (such as labs, EKG, etc.), we will make arrangements to do so.   ?  ?Although advances in technology are sophisticated, we cannot ensure that it will always work on either your end or our end.  If the connection with a video visit is poor, the visit may have to be switched to a telephone visit.  With either a video or telephone visit, we are not always able to ensure that we have a secure connection.    ? ?I need to obtain your verbal consent now.   Are you willing to proceed with your visit today?  ?  ?Lauren Bond has provided verbal consent on 02/25/2022 for a virtual visit (video or telephone). ?  ?Mar Daring, PA-C  ? ?Date: 02/25/2022 2:05 PM ? ? ?Virtual Visit via Video Note  ? ?Lauren Bond, connected with  Annasophia Bond  ([id number], 2001/01/26) on 02/25/22 at  2:00 PM EDT by a video-enabled telemedicine application and verified that I am speaking with the correct person using two identifiers. ? ?Location: ?Patient: Virtual Visit Location Patient: Home ?Provider: Virtual Visit Location Provider: Home Office ?  ?I discussed the limitations of evaluation and management by telemedicine  and the availability of in person appointments. The patient expressed understanding and agreed to proceed.   ? ?History of Present Illness: ?Lauren Bond is a 21 y.o. who identifies as a female who was assigned female at birth, and is being seen today for nausea. Has history of migraine and reports had one last night. Took migraine medication (Excedrin migraine) around 3am. Still having neck pain and pain on back of skull (occipital region) that is causing nausea. Does have visual auras with migraines. This one is similar to previous. Has used Imitrex (sumatriptan) in the past as well, but declines much relief from that medication and does not use often. Is followed by [facility].  ? ? ?Problems: There are no problems to display for this patient. ?  ?Allergies:  ?Allergies  ?Allergen Reactions  ? Penicillins Rash  ? ?Medications:  ?Current Outpatient Medications:  ?  rizatriptan (MAXALT) 5 MG tablet, Take 1 tablet (5 mg total) by mouth as needed for migraine. May repeat in 2 hours if needed, Disp: 10 tablet, Rfl: 0 ?  buPROPion (WELLBUTRIN XL) 300 MG 24 hr tablet, Take 300 mg by mouth daily., Disp: , Rfl:  ?  cyclobenzaprine (FLEXERIL) 10 MG tablet, Take 1 tablet (10 mg total) by mouth 3 (three) times daily as needed for muscle spasms., Disp: 30 tablet, Rfl: 0 ?  diazepam (VALIUM) 2 MG  tablet, Take by mouth., Disp: , Rfl:  ?  ergocalciferol (VITAMIN D2) 1.25 MG (50000 UT) capsule, Take 1 capsule by mouth once a week., Disp: , Rfl:  ?  hydrOXYzine (ATARAX) 10 MG tablet, TAKE 2 TABLETS BY MOUTH AT BEDTIME AS NEEDED, Disp: , Rfl:  ?  meloxicam (MOBIC) 15 MG tablet, Take 1 tablet (15 mg total) by mouth daily., Disp: 15 tablet, Rfl: 0 ?  metoprolol succinate (TOPROL-XL) 25 MG 24 hr tablet, Take 25 mg by mouth in the morning and at bedtime., Disp: , Rfl:  ?  ondansetron (ZOFRAN-ODT) 4 MG disintegrating tablet, Take 1 tablet (4 mg total) by mouth every 8 (eight) hours as needed for nausea or vomiting., Disp:  30 tablet, Rfl: 0 ?  TRI-LO-SPRINTEC 0.18/0.215/0.25 MG-25 MCG tab, Take 1 tablet by mouth daily., Disp: , Rfl:  ?  triamcinolone lotion (KENALOG) 0.1 %, Apply 1 application topically 3 (three) times daily., Disp: , Rfl:  ?  venlafaxine XR (EFFEXOR-XR) 75 MG 24 hr capsule, Take 75 mg by mouth daily., Disp: , Rfl:  ? ?Observations/Objective: ?Patient is well-developed, well-nourished in no acute distress.  ?Resting comfortably at home.  ?Head is normocephalic, atraumatic.  ?No labored breathing.  ?Speech is clear and coherent with logical content.  ?Patient is alert and oriented at baseline.  ? ? ?Assessment and Plan: ?1. Migraine with aura and without status migrainosus, not intractable ?- cyclobenzaprine (FLEXERIL) 10 MG tablet; Take 1 tablet (10 mg total) by mouth 3 (three) times daily as needed for muscle spasms.  Dispense: 30 tablet; Refill: 0 ?- ondansetron (ZOFRAN-ODT) 4 MG disintegrating tablet; Take 1 tablet (4 mg total) by mouth every 8 (eight) hours as needed for nausea or vomiting.  Dispense: 30 tablet; Refill: 0 ?- rizatriptan (MAXALT) 5 MG tablet; Take 1 tablet (5 mg total) by mouth as needed for migraine. May repeat in 2 hours if needed  Dispense: 10 tablet; Refill: 0 ? ?- Symptoms consistent with previous migraines ?- Will change imitrex to maxalt since not had relief ?- Zofran for nausea ?- Flexeril for tension-type headaches that are associated with her migraines; also has DDD and cervical spine issues that contribute to headaches as well ?- Tylenol (Excedrin Migraine) as needed, but watch for rebound headache ?- Push fluids ?- Rest ?- Seek in person evaluation if symptoms worsen or fail to improve ? ?Follow Up Instructions: ?I discussed the assessment and treatment plan with the patient. The patient was provided an opportunity to ask questions and all were answered. The patient agreed with the plan and demonstrated an understanding of the instructions.  A copy of instructions were sent to the  patient via MyChart unless otherwise noted below.  ? ? ?The patient was advised to call back or seek an in-person evaluation if the symptoms worsen or if the condition fails to improve as anticipated. ? ?Time:  ?I spent 10 minutes with the patient via telehealth technology discussing the above problems/concerns.   ? ?Mar Daring, PA-C ?

## 2022-02-26 ENCOUNTER — Telehealth: Payer: BC Managed Care – PPO | Admitting: Family Medicine

## 2022-02-26 ENCOUNTER — Telehealth: Payer: BC Managed Care – PPO

## 2022-02-26 ENCOUNTER — Encounter: Payer: Self-pay | Admitting: Family Medicine

## 2022-02-26 DIAGNOSIS — R112 Nausea with vomiting, unspecified: Secondary | ICD-10-CM

## 2022-02-26 NOTE — Progress Notes (Signed)
?Virtual Visit Consent  ? ?Lauren Bond, you are scheduled for a virtual visit with a [facility] provider today.   ?  ?Just as with appointments in the office, your consent must be obtained to participate.  Your consent will be active for this visit and any virtual visit you may have with one of our providers in the next 365 days.   ?  ?If you have a MyChart account, a copy of this consent can be sent to you electronically.  All virtual visits are billed to your insurance company just like a traditional visit in the office.   ? ?As this is a virtual visit, video technology does not allow for your provider to perform a traditional examination.  This may limit your provider's ability to fully assess your condition.  If your provider identifies any concerns that need to be evaluated in person or the need to arrange testing (such as labs, EKG, etc.), we will make arrangements to do so.   ?  ?Although advances in technology are sophisticated, we cannot ensure that it will always work on either your end or our end.  If the connection with a video visit is poor, the visit may have to be switched to a telephone visit.  With either a video or telephone visit, we are not always able to ensure that we have a secure connection.    ? ?I need to obtain your verbal consent now.   Are you willing to proceed with your visit today?  ?  ?Lauren Bond has provided verbal consent on 02/26/2022 for a virtual visit (video or telephone). ?  ?Lauren Finner, NP  ? ?Date: 02/26/2022 3:58 PM ? ? ?Virtual Visit via Video Note  ? ?IFreddy Bond, [name]  ([id number], [birth year]) on 02/26/22 at  3:45 PM EDT by a video-enabled telemedicine application and verified that I am speaking with the correct person using two identifiers. ? ?Location: ?Patient: Virtual Visit Location Patient: Home ?Provider: Virtual Visit Location Provider: Home Office ?  ?I discussed the limitations of evaluation and management by telemedicine and the  availability of in person appointments. The patient expressed understanding and agreed to proceed.   ? ?History of Present Illness: ?Lauren Bond is a 21 y.o. who identifies as a female who was assigned female at birth, and is being seen today for on going nausea, has not been able to get the Zofran sent in by my colleague yesterday. Reports calling out of work and needs note secondary to this.  ? ?Reports still having migraine as well, but unable to get medication so will try to find someone to get her medication today.  ? ?Denies having changes in symptoms. ? ?Problems: There are no problems to display for this patient. ?  ?Allergies:  ?Allergies  ?Allergen Reactions  ? Penicillins Rash  ? ?Medications:  ?Current Outpatient Medications:  ?  buPROPion (WELLBUTRIN XL) 300 MG 24 hr tablet, Take 300 mg by mouth daily., Disp: , Rfl:  ?  cyclobenzaprine (FLEXERIL) 10 MG tablet, Take 1 tablet (10 mg total) by mouth 3 (three) times daily as needed for muscle spasms., Disp: 30 tablet, Rfl: 0 ?  diazepam (VALIUM) 2 MG tablet, Take by mouth., Disp: , Rfl:  ?  ergocalciferol (VITAMIN D2) 1.25 MG (50000 UT) capsule, Take 1 capsule by mouth once a week., Disp: , Rfl:  ?  hydrOXYzine (ATARAX) 10 MG tablet, TAKE 2 TABLETS BY MOUTH AT BEDTIME AS NEEDED, Disp: ,  Rfl:  ?  meloxicam (MOBIC) 15 MG tablet, Take 1 tablet (15 mg total) by mouth daily., Disp: 15 tablet, Rfl: 0 ?  metoprolol succinate (TOPROL-XL) 25 MG 24 hr tablet, Take 25 mg by mouth in the morning and at bedtime., Disp: , Rfl:  ?  ondansetron (ZOFRAN-ODT) 4 MG disintegrating tablet, Take 1 tablet (4 mg total) by mouth every 8 (eight) hours as needed for nausea or vomiting., Disp: 30 tablet, Rfl: 0 ?  rizatriptan (MAXALT) 5 MG tablet, Take 1 tablet (5 mg total) by mouth as needed for migraine. May repeat in 2 hours if needed, Disp: 10 tablet, Rfl: 0 ?  TRI-LO-SPRINTEC 0.18/0.215/0.25 MG-25 MCG tab, Take 1 tablet by mouth daily., Disp: , Rfl:  ?  triamcinolone lotion  (KENALOG) 0.1 %, Apply 1 application topically 3 (three) times daily., Disp: , Rfl:  ?  venlafaxine XR (EFFEXOR-XR) 75 MG 24 hr capsule, Take 75 mg by mouth daily., Disp: , Rfl:  ? ?Observations/Objective: ?Patient is well-developed, well-nourished in no acute distress.  ?Resting comfortably  at home.  ?Head is normocephalic, atraumatic.  ?No labored breathing.  ?Speech is clear and coherent with logical content.  ?Patient is alert and oriented at baseline.  ? ? ?Assessment and Plan: ?1. Nausea and vomiting, unspecified vomiting type ? ?Continue same treatment plan. ? ?Advised to be seen in person given no improvement- reports she will have someone take her tomorrow if not better. ? ? Reviewed side effects, risks and benefits of medication.   ? ?Work note for today provided ? ? ?Follow Up Instructions: ?I discussed the assessment and treatment plan with the patient. The patient was provided an opportunity to ask questions and all were answered. The patient agreed with the plan and demonstrated an understanding of the instructions.  A copy of instructions were sent to the patient via MyChart unless otherwise noted below.  ? ? ?The patient was advised to call back or seek an in-person evaluation if the symptoms worsen or if the condition fails to improve as anticipated. ? ?Time:  ?I spent 10 minutes with the patient via telehealth technology discussing the above problems/concerns.   ? ?Lauren Finner, NP ? ?

## 2022-02-26 NOTE — Patient Instructions (Signed)
Nausea, Adult ?Nausea is the feeling of having an upset stomach or that you are about to vomit. Nausea on its own is not usually a serious concern, but it may be an early sign of a more serious medical problem. As nausea gets worse, it can lead to vomiting. If vomiting develops, or if you are not able to drink enough fluids, you are at risk of becoming dehydrated. ?Dehydration can make you tired and thirsty, cause you to have a dry mouth, and decrease how often you urinate. Older adults and people with other diseases or a weak disease-fighting system (immune system) are at higher risk for dehydration. The main goals of treating your nausea are: ?To relieve your nausea. ?To limit repeated nausea episodes. ?To prevent vomiting and dehydration. ?Follow these instructions at home: ?Watch your symptoms for any changes. Tell your health care provider about them. ?Eating and drinking ?  ?Take an oral rehydration solution (ORS). This is a drink that is sold at pharmacies and retail stores. ?Drink clear fluids slowly and in small amounts as you are able. Clear fluids include water, ice chips, low-calorie sports drinks, and fruit juice that has water added (diluted fruit juice). ?Eat bland, easy-to-digest foods in small amounts as you are able. These foods include bananas, applesauce, rice, lean meats, toast, and crackers. ?Avoid drinking fluids that contain a lot of sugar or caffeine, such as energy drinks, sports drinks, and soda. ?Avoid alcohol. ?Avoid spicy or fatty foods. ?General instructions ?Take over-the-counter and prescription medicines only as told by your health care provider. ?Rest at home while you recover. ?Drink enough fluid to keep your urine pale yellow. ?Breathe slowly and deeply when you feel nauseous. ?Avoid smelling things that have strong odors. ?Wash your hands often using soap and water for at least 20 seconds. If soap and water are not available, use hand sanitizer. ?Make sure that everyone in your  household washes their hands well and often. ?Keep all follow-up visits. This is important. ?Contact a health care provider if: ?Your nausea gets worse. ?Your nausea does not go away after two days. ?You vomit multiple times. ?You cannot drink fluids without vomiting. ?You have any of the following: ?New symptoms. ?A fever. ?A headache. ?Muscle cramps. ?A rash. ?Pain while urinating. ?You feel light-headed or dizzy. ?Get help right away if: ?You have pain in your chest, neck, arm, or jaw. ?You feel extremely weak or you faint. ?You have vomit that is bright red or looks like coffee grounds. ?You have bloody or black stools (feces) or stools that look like tar. ?You have a severe headache, a stiff neck, or both. ?You have severe pain, cramping, or bloating in your abdomen. ?You have difficulty breathing or are breathing very quickly. ?Your heart is beating very quickly. ?Your skin feels cold and clammy. ?You feel confused. ?You have signs of dehydration, such as: ?Dark urine, very little urine, or no urine. ?Cracked lips. ?Dry mouth. ?Sunken eyes. ?Sleepiness. ?Weakness. ?These symptoms may be an emergency. Get help right away. Call 911. ?Do not wait to see if the symptoms will go away. ?Do not drive yourself to the hospital. ?Summary ?Nausea is the feeling that you have an upset stomach or that you are about to vomit. Nausea on its own is not usually a serious concern, but it may be an early sign of a more serious medical problem. ?If vomiting develops, or if you are not able to drink enough fluids, you are at risk of becoming dehydrated. ?Follow   recommendations for eating and drinking and take over-the-counter and prescription medicines only as told by your health care provider. ?Contact a health care provider right away if your symptoms worsen or you have new symptoms. ?Keep all follow-up visits. This is important. ?This information is not intended to replace advice given to you by your health care provider. Make  sure you discuss any questions you have with your health care provider. ?Document Revised: 06/08/2021 Document Reviewed: 06/08/2021 ?Elsevier Patient Education ? 2022 Elsevier Inc. ? ?

## 2022-03-07 ENCOUNTER — Other Ambulatory Visit: Payer: Self-pay

## 2022-03-07 ENCOUNTER — Encounter: Payer: Self-pay | Admitting: Emergency Medicine

## 2022-03-07 ENCOUNTER — Emergency Department
Admission: EM | Admit: 2022-03-07 | Discharge: 2022-03-07 | Disposition: A | Discharge status: Home / Self Care | Payer: BC Managed Care – PPO | Attending: Emergency Medicine | Admitting: Emergency Medicine

## 2022-03-07 DIAGNOSIS — R002 Palpitations: Secondary | ICD-10-CM | POA: Insufficient documentation

## 2022-03-07 DIAGNOSIS — R000 Tachycardia, unspecified: Secondary | ICD-10-CM | POA: Insufficient documentation

## 2022-03-07 LAB — CBC WITH DIFFERENTIAL/PLATELET
Abs Immature Granulocytes: 0.02 10*3/uL (ref 0.00–0.07)
Basophils Absolute: 0.1 10*3/uL (ref 0.0–0.1)
Basophils Relative: 1 %
Eosinophils Absolute: 0.3 10*3/uL (ref 0.0–0.5)
Eosinophils Relative: 3 %
HCT: 44.8 % (ref 36.0–46.0)
Hemoglobin: 15 g/dL (ref 12.0–15.0)
Immature Granulocytes: 0 %
Lymphocytes Relative: 33 %
Lymphs Abs: 3.3 10*3/uL (ref 0.7–4.0)
MCH: 28.7 pg (ref 26.0–34.0)
MCHC: 33.5 g/dL (ref 30.0–36.0)
MCV: 85.7 fL (ref 80.0–100.0)
Monocytes Absolute: 0.9 10*3/uL (ref 0.1–1.0)
Monocytes Relative: 9 %
Neutro Abs: 5.5 10*3/uL (ref 1.7–7.7)
Neutrophils Relative %: 54 %
Platelets: 414 10*3/uL — ABNORMAL HIGH (ref 150–400)
RBC: 5.23 MIL/uL — ABNORMAL HIGH (ref 3.87–5.11)
RDW: 12.5 % (ref 11.5–15.5)
WBC: 10.1 10*3/uL (ref 4.0–10.5)
nRBC: 0 % (ref 0.0–0.2)

## 2022-03-07 LAB — COMPREHENSIVE METABOLIC PANEL
ALT: 22 U/L (ref 0–44)
AST: 20 U/L (ref 15–41)
Albumin: 4 g/dL (ref 3.5–5.0)
Alkaline Phosphatase: 63 U/L (ref 38–126)
Anion gap: 10 (ref 5–15)
BUN: 10 mg/dL (ref 6–20)
CO2: 26 mmol/L (ref 22–32)
Calcium: 9.4 mg/dL (ref 8.9–10.3)
Chloride: 103 mmol/L (ref 98–111)
Creatinine, Ser: 0.74 mg/dL (ref 0.44–1.00)
GFR, Estimated: >60 mL/min (ref >60)
Glucose, Bld: 111 mg/dL — ABNORMAL HIGH (ref 70–99)
Potassium: 4.3 mmol/L (ref 3.5–5.1)
Sodium: 139 mmol/L (ref 135–145)
Total Bilirubin: 0.4 mg/dL (ref 0.3–1.2)
Total Protein: 8.2 g/dL — ABNORMAL HIGH (ref 6.5–8.1)

## 2022-03-07 LAB — D-DIMER, QUANTITATIVE: D-Dimer, Quant: <0.27 ug/mL-FEU (ref 0.00–0.50)

## 2022-03-07 LAB — TROPONIN I (HIGH SENSITIVITY)
Troponin I (High Sensitivity): 3 ng/L (ref <18)
Troponin I (High Sensitivity): 3 ng/L (ref <18)

## 2022-03-07 MED ORDER — DROPERIDOL 2.5 MG/ML IJ SOLN
1.2500 mg | Freq: Once | INTRAMUSCULAR | Status: AC
  Administered 2022-03-07: 1.25 mg via INTRAVENOUS
  Filled 2022-03-07: qty 2

## 2022-03-07 NOTE — ED Notes (Signed)
RN first encounter with pt prior to discharge. Pt verbalized understanding of discharge instructions and follow-up care instructions. Pt advised if symptoms worsen to return to ED.  

## 2022-03-07 NOTE — ED Provider Notes (Signed)
? ?[facility] ?Provider Note ? ? ? Event Date/Time  ? First MD Initiated Contact with Patient 03/07/22 1746   ?  (approximate) ? ? ?History  ? ?Palpitations ? ? ?HPI ? ?Lauren Bond is a 21 y.o. female who presents to the ED for evaluation of Palpitations ?  ?I reviewed cardiology visit from 12/8.  History of obesity, SVT proximally and anxiety.  Started on daily metoprolol.  Normal echo in the past. ? ?Patient presents to the ED for evaluation of palpitations, anxiety and chest pain.  She reports palpitations for a matter of weeks, and occasionally her Apple Watch tells her that her heart rate increases to the 200s.  She denies any syncopal episodes, fevers or illnesses.  Denies any abdominal pain or emesis.  Reports tolerating p.o. intake and toileting at her baseline.  Reports compliance with her metoprolol.  Denies any pain right now. ? ?During our conversation, she suddenly erupts in tears.  Reports concern that she knows that nothing is wrong and that she hates having the symptoms and knowing that everything is probably normal. ? ?Physical Exam  ? ?Triage Vital Signs: ?ED Triage Vitals  ?Enc Vitals Group  ?   BP 03/07/22 1645 138/78  ?   Pulse Rate 03/07/22 1645 (!) 118  ?   Resp 03/07/22 1645 17  ?   Temp 03/07/22 1645 99.2 ?F (37.3 ?C)  ?   Temp src --   ?   SpO2 03/07/22 1645 98 %  ?   Weight --   ?   Height --   ?   Head Circumference --   ?   Peak Flow --   ?   Pain Score 03/07/22 1647 5  ?   Pain Loc --   ?   Pain Edu? --   ?   Excl. in GC? --   ? ? ?Most recent vital signs: ?Vitals:  ? 03/07/22 1645 03/07/22 2000  ?BP: 138/78 110/75  ?Pulse: (!) 118 (!) 102  ?Resp: 17 20  ?Temp: 99.2 ?F (37.3 ?C) 98.6 ?F (37 ?C)  ?SpO2: 98% 99%  ? ? ?General: Awake, no distress.  Obese.  Tearful seemingly spontaneously during my evaluation. ?CV:  Good peripheral perfusion.  Tachycardic and regular ?Resp:  Normal effort.  ?Abd:  No distention.  ?MSK:  No deformity noted.  ?Neuro:  No focal deficits  appreciated. ?Other:   ? ? ?ED Results / Procedures / Treatments  ? ?Labs ?(all labs ordered are listed, but only abnormal results are displayed) ?Labs Reviewed  ?COMPREHENSIVE METABOLIC PANEL - Abnormal; Notable for the following components:  ?    Result Value  ? Glucose, Bld 111 (*)   ? Total Protein 8.2 (*)   ? All other components within normal limits  ?CBC WITH DIFFERENTIAL/PLATELET - Abnormal; Notable for the following components:  ? RBC 5.23 (*)   ? Platelets 414 (*)   ? All other components within normal limits  ?D-DIMER, QUANTITATIVE  ?TROPONIN I (HIGH SENSITIVITY)  ?TROPONIN I (HIGH SENSITIVITY)  ? ? ?EKG ?Sinus tachycardia with a rate of 125 bpm.  Rightward axis and/or normal intervals.  Nonspecific ST changes laterally and inferiorly without STEMI. ? ?RADIOLOGY ? ? ?Official radiology report(s): ?No results found. ? ?PROCEDURES and INTERVENTIONS: ? ?.1-3 Lead EKG Interpretation ?Performed by: Delton Prairie, MD ?Authorized by: Delton Prairie, MD  ? ?  Interpretation: abnormal   ?  ECG rate:  103 ?  ECG rate assessment: tachycardic   ?  Rhythm: sinus tachycardia   ?  Ectopy: none   ?  Conduction: normal   ? ?Medications  ?droperidol (INAPSINE) 2.5 MG/ML injection 1.25 mg (1.25 mg Intravenous Given 03/07/22 2014)  ? ? ? ?IMPRESSION / MDM / ASSESSMENT AND PLAN / ED COURSE  ?I reviewed the triage vital signs and the nursing notes. ? ?21 year old female with history of SVT and anxiety presents to the ED with palpitations, without evidence of SVT or dysrhythmias, possibly due to anxiety, but ultimately suitable for outpatient management.  Looks clinically well on exam.  Is tearful, but no evidence of psychiatric emergency to require IVC or emergent psychiatric evaluation.  She is somewhat tachycardic, but in a sinus tach and she is maintained on the monitor without any dysrhythmias or bursts of SVT.  D-dimer is negative, effectively ruling out acute PE.  Troponin is negative, EKG is nonischemic and her electrolytes  are within normal limits.  Normal CBC.  I urged her to follow-up with her cardiologist to discuss another Holter monitor if her Apple Watch is really telling her about such rapid rates that could represent recurrence of SVT despite her metoprolol.  We discussed return precautions. ? ?  ? ? ?FINAL CLINICAL IMPRESSION(S) / ED DIAGNOSES  ? ?Final diagnoses:  ?Palpitations  ? ? ? ?Rx / DC Orders  ? ?ED Discharge Orders   ? ? None  ? ?  ? ? ? ?Note:  This document was prepared using Dragon voice recognition software and may include unintentional dictation errors. ?  ?Delton Prairie, MD ?03/07/22 2332 ? ?

## 2022-03-07 NOTE — ED Provider Triage Note (Signed)
Emergency Medicine Provider Triage Evaluation Note ? [name] , a 21 y.o. female  was evaluated in triage.  Pt complains of heart fluttering.  Patient takes metoprolol 25 mg twice daily.  Does have migraines so has been taking 200 mg of caffeine daily.  No fever or chills.  No cough or congestion.  Patient states she did feel dizzy and a little lightheaded for the last 2 days. ? ?Review of Systems  ?Positive: Heart fluttering, dizziness\lightheaded ?Negative: Fever, chills ? ?Physical Exam  ?There were no vitals taken for this visit. ?Gen:   Awake, no distress   ?Resp:  Normal effort  ?MSK:   Moves extremities without difficulty  ?Other:   ? ?Medical Decision Making  ?Medically screening exam initiated at 4:44 PM.  Appropriate orders placed.  Lauren Bond was informed that the remainder of the evaluation will be completed by another provider, this initial triage assessment does not replace that evaluation, and the importance of remaining in the ED until their evaluation is complete. ? ? ?  ?Faythe Ghee, PA-C ?03/07/22 1648 ? ?

## 2022-03-07 NOTE — Discharge Instructions (Signed)
Please reach out to her cardiologist to discuss another Holter monitor.  Continue your metoprolol ?Return to the ED with any persistent SVT, new chest pains, passing out or other concerns ?

## 2022-03-07 NOTE — ED Triage Notes (Signed)
Pt comes into the ED via POV c/o palpitations that started today but she also had an episode last week.  Pt admits to some chest tightness but no pain.  Pt denies any cardiac history other than sinus tachy.  Pt in NAD at this time with even and unlabored respirations.  Pt admits to nausea, SHOB, and dizziness.   ?

## 2022-03-13 ENCOUNTER — Telehealth: Payer: BC Managed Care – PPO | Admitting: Urgent Care

## 2022-03-13 DIAGNOSIS — M549 Dorsalgia, unspecified: Secondary | ICD-10-CM

## 2022-03-13 NOTE — Progress Notes (Signed)
Based on what you shared with me, I feel your condition warrants further evaluation and I recommend that you be seen in a face to face visit. It appears that you have been seen several times via telehealth in the past month for similar concerning symptoms, including recent scripts for mobic and cyclobenzaprine. It also appears that you have a neurologist who is evaluating you for your back concerns. Please reach out to your specialist or head to the nearest urgent care so you can received the most appropriate treatment. ?  ?NOTE: There will be NO CHARGE for this eVisit ?  ?If you are having a true medical emergency please call 911.   ?  ? For an urgent face to face visit, [facility] has six urgent care centers for your convenience:  ?  ? [facility] ?Get Driving Directions ?[phone] ?[address] ?[place], [place] [address] ?  ? [facility]) ?Get Driving Directions ?[phone] ?[address] ?[place], [place] [address] ?  ?[facility] [place] [facility]) ?Get Driving Directions ?[place],  [place]  [address] ?  ?[facility] ?Get Driving Directions ?[phone] ?[address] ?[place], [place] [address] ?  ?[facility] [place] ?Get Driving Directions  ?[phone] ?[address] ?[address] ?[place], [place] [address] ?  ?[facility] ?Get Driving Directions ?[phone] ?[address] ?[place],  [address] ?  ?Your MyChart E-visit questionnaire answers were reviewed by a board certified advanced clinical practitioner to complete your personal care plan based on your specific symptoms.  Thank you for using e-Visits. ?   ?  ? ?

## 2022-03-25 ENCOUNTER — Telehealth: Payer: BC Managed Care – PPO | Admitting: Physician Assistant

## 2022-03-25 DIAGNOSIS — H60391 Other infective otitis externa, right ear: Secondary | ICD-10-CM | POA: Diagnosis not present

## 2022-03-25 MED ORDER — NEOMYCIN-POLYMYXIN-HC 3.5-10000-1 OT SOLN: 3.0000 [drp] | Freq: Four times a day (QID) | OTIC | 0 refills | Status: AC

## 2022-03-25 NOTE — Progress Notes (Signed)

## 2022-04-29 ENCOUNTER — Telehealth: Payer: BC Managed Care – PPO | Admitting: Physician Assistant

## 2022-04-29 DIAGNOSIS — R197 Diarrhea, unspecified: Secondary | ICD-10-CM

## 2022-04-29 NOTE — Progress Notes (Signed)
Based on what you shared with me, I feel your condition warrants further evaluation and I recommend that you be seen in a face to face visit. ? ?I honestly would recommend being evaluated in person giving pus/mucous in the stool to determine if this is infectious in nature or from some other cause of inflammation in the bowel, as treatments are different.  ?  ?NOTE: There will be NO CHARGE for this eVisit ?  ?If you are having a true medical emergency please call 911.   ?  ? For an urgent face to face visit, [facility] has six urgent care centers for your convenience:  ?  ? [facility] ?Get Driving Directions ?[phone] ?[phone] ?[place], [place] [address] ?  ? [facility]) ?Get Driving Directions ?[phone] ?[address] ?[place], [place] [address] ? ?[facility]) ?Get Driving Directions ?[phone] ?[address] ?[place],  [place]  [address] ? ?[facility] ?Get Driving Directions ?[phone] ?[address] ?[place], [place] [address] ?  ?[facility] [place] ?Get Driving Directions  ?[phone] ?[address] ?[address] ?[place], [place] [address] ?  ?[facility] ?Get Driving Directions ?[phone] ?[address] ?[place], [place] [address] ? ?Your MyChart E-visit questionnaire answers were reviewed by a board certified advanced clinical practitioner to complete your personal care plan based on your specific symptoms.  Thank you for using e-Visits. ?  ? ?

## 2022-06-12 ENCOUNTER — Telehealth: Payer: BC Managed Care – PPO | Admitting: Physician Assistant

## 2022-06-12 DIAGNOSIS — R110 Nausea: Secondary | ICD-10-CM | POA: Diagnosis not present

## 2022-06-12 MED ORDER — ONDANSETRON 4 MG PO TBDP: 4.0000 mg | ORAL_TABLET | Freq: Three times a day (TID) | ORAL | 0 refills | Status: AC | PRN

## 2022-06-12 NOTE — Progress Notes (Signed)
I have spent 5 minutes in review of e-visit questionnaire, review and updating patient chart, medical decision making and response to patient.   Alynn Ellithorpe Cody Cher Franzoni, PA-C    

## 2022-06-12 NOTE — Progress Notes (Signed)
E-Visit for Nausea and Vomiting   We are sorry that you are not feeling well. Here is how we plan to help!  Based on what you have shared with me it looks like you are having nausea secondary to migraine headache. Please keep hydrated and you can use OTC Excedrin migraine for the headache itself.   I have prescribed a medication that will help alleviate your symptoms and allow you to stay hydrated:  Zofran 4 mg 1 tablet every 8 hours as needed for nausea and vomiting  HOME CARE: Drink clear liquids.  This is very important! Dehydration (the lack of fluid) can lead to a serious complication.  Start off with 1 tablespoon every 5 minutes for 8 hours. You may begin eating bland foods after 8 hours without vomiting.  Start with saltine crackers, white bread, rice, mashed potatoes, applesauce. After 48 hours on a bland diet, you may resume a normal diet. Try to go to sleep.  Sleep often empties the stomach and relieves the need to vomit.  GET HELP RIGHT AWAY IF:  Your symptoms do not improve or worsen within 2 days after treatment. You have a fever for over 3 days. You cannot keep down fluids after trying the medication.  MAKE SURE YOU:  Understand these instructions. Will watch your condition. Will get help right away if you are not doing well or get worse.    Thank you for choosing an e-visit.  Your e-visit answers were reviewed by a board certified advanced clinical practitioner to complete your personal care plan. Depending upon the condition, your plan could have included both over the counter or prescription medications.  Please review your pharmacy choice. Make sure the pharmacy is open so you can pick up prescription now. If there is a problem, you may contact your provider through [employer] and have the prescription routed to another pharmacy.  Your safety is important to [place]. If you have drug allergies check your prescription carefully.   For the next 24 hours you can  use MyChart to ask questions about today's visit, request a non-urgent call back, or ask for a work or school excuse. You will get an email in the next two days asking about your experience. I hope that your e-visit has been valuable and will speed your recovery.

## 2022-07-01 ENCOUNTER — Telehealth: Payer: BC Managed Care – PPO | Admitting: Physician Assistant

## 2022-07-01 DIAGNOSIS — J019 Acute sinusitis, unspecified: Secondary | ICD-10-CM

## 2022-07-01 DIAGNOSIS — B9789 Other viral agents as the cause of diseases classified elsewhere: Secondary | ICD-10-CM

## 2022-07-01 MED ORDER — AZELASTINE HCL 0.1 % NA SOLN: 1.0000 | Freq: Two times a day (BID) | NASAL | 0 refills | Status: AC

## 2022-07-01 NOTE — Progress Notes (Signed)
I have spent 5 minutes in review of e-visit questionnaire, review and updating patient chart, medical decision making and response to patient.   Emmanual Gauthreaux Cody Cadi Rhinehart, PA-C    

## 2022-07-01 NOTE — Progress Notes (Signed)

## 2023-03-17 ENCOUNTER — Encounter: Payer: Self-pay | Admitting: Cardiology
